# Patient Record
Sex: Female | Born: 1980 | Hispanic: No | Marital: Single | Smoking: Current every day smoker
Health system: Southern US, Community
[De-identification: ages and names within clinical notes are randomized; demographics above are authoritative.]

---

## 2016-04-07 ENCOUNTER — Emergency Department (HOSPITAL_COMMUNITY)
Admission: EM | Admit: 2016-04-07 | Discharge: 2016-04-07 | Disposition: A | Discharge status: Home / Self Care | Payer: Self-pay | Attending: Emergency Medicine | Admitting: Emergency Medicine

## 2016-04-07 ENCOUNTER — Encounter (HOSPITAL_COMMUNITY): Payer: Self-pay | Admitting: Emergency Medicine

## 2016-04-07 ENCOUNTER — Emergency Department (HOSPITAL_COMMUNITY): Payer: Self-pay

## 2016-04-07 DIAGNOSIS — Z23 Encounter for immunization: Secondary | ICD-10-CM | POA: Insufficient documentation

## 2016-04-07 DIAGNOSIS — S0093XA Contusion of unspecified part of head, initial encounter: Secondary | ICD-10-CM | POA: Insufficient documentation

## 2016-04-07 DIAGNOSIS — S0990XA Unspecified injury of head, initial encounter: Secondary | ICD-10-CM

## 2016-04-07 DIAGNOSIS — Y92410 Unspecified street and highway as the place of occurrence of the external cause: Secondary | ICD-10-CM | POA: Insufficient documentation

## 2016-04-07 DIAGNOSIS — F17210 Nicotine dependence, cigarettes, uncomplicated: Secondary | ICD-10-CM | POA: Insufficient documentation

## 2016-04-07 DIAGNOSIS — W19XXXA Unspecified fall, initial encounter: Secondary | ICD-10-CM

## 2016-04-07 DIAGNOSIS — Y999 Unspecified external cause status: Secondary | ICD-10-CM | POA: Insufficient documentation

## 2016-04-07 DIAGNOSIS — Y9339 Activity, other involving climbing, rappelling and jumping off: Secondary | ICD-10-CM | POA: Insufficient documentation

## 2016-04-07 LAB — I-STAT BETA HCG BLOOD, ED (MC, WL, AP ONLY): I-stat hCG, quantitative: <5 m[IU]/mL (ref <5)

## 2016-04-07 LAB — BASIC METABOLIC PANEL
Anion gap: 9 (ref 5–15)
BUN: 11 mg/dL (ref 6–20)
CALCIUM: 8.8 mg/dL — AB (ref 8.9–10.3)
CO2: 26 mmol/L (ref 22–32)
CREATININE: 0.67 mg/dL (ref 0.44–1.00)
Chloride: 102 mmol/L (ref 101–111)
GFR calc Af Amer: >60 mL/min (ref >60)
GLUCOSE: 107 mg/dL — AB (ref 65–99)
POTASSIUM: 3.4 mmol/L — AB (ref 3.5–5.1)
Sodium: 137 mmol/L (ref 135–145)

## 2016-04-07 LAB — CBC WITH DIFFERENTIAL/PLATELET
Basophils Absolute: 0 10*3/uL (ref 0.0–0.1)
Basophils Relative: 0 %
EOS PCT: 2 %
Eosinophils Absolute: 0.1 10*3/uL (ref 0.0–0.7)
HCT: 38.1 % (ref 36.0–46.0)
Hemoglobin: 12.8 g/dL (ref 12.0–15.0)
LYMPHS ABS: 1.3 10*3/uL (ref 0.7–4.0)
LYMPHS PCT: 21 %
MCH: 32.2 pg (ref 26.0–34.0)
MCHC: 33.6 g/dL (ref 30.0–36.0)
MCV: 95.7 fL (ref 78.0–100.0)
MONO ABS: 0.4 10*3/uL (ref 0.1–1.0)
MONOS PCT: 6 %
Neutro Abs: 4.5 10*3/uL (ref 1.7–7.7)
Neutrophils Relative %: 71 %
PLATELETS: 238 10*3/uL (ref 150–400)
RBC: 3.98 MIL/uL (ref 3.87–5.11)
RDW: 13.4 % (ref 11.5–15.5)
WBC: 6.3 10*3/uL (ref 4.0–10.5)

## 2016-04-07 LAB — ETHANOL: Alcohol, Ethyl (B): <5 mg/dL (ref <5)

## 2016-04-07 MED ORDER — TETANUS-DIPHTH-ACELL PERTUSSIS 5-2.5-18.5 LF-MCG/0.5 IM SUSP
0.5000 mL | Freq: Once | INTRAMUSCULAR | Status: AC
  Administered 2016-04-07: 0.5 mL via INTRAMUSCULAR
  Filled 2016-04-07: qty 0.5

## 2016-04-07 MED ORDER — NAPROXEN 500 MG PO TABS: 500.0000 mg | ORAL_TABLET | Freq: Two times a day (BID) | ORAL | 0 refills | Status: AC

## 2016-04-07 MED ORDER — ONDANSETRON HCL 4 MG PO TABS: 4.0000 mg | ORAL_TABLET | Freq: Four times a day (QID) | ORAL | 0 refills | Status: DC

## 2016-04-07 MED ORDER — ONDANSETRON HCL 4 MG PO TABS: 4.0000 mg | ORAL_TABLET | Freq: Four times a day (QID) | ORAL | 0 refills | Status: AC

## 2016-04-07 MED ORDER — FENTANYL CITRATE (PF) 100 MCG/2ML IJ SOLN
50.0000 ug | Freq: Once | INTRAMUSCULAR | Status: AC
  Administered 2016-04-07: 50 ug via INTRAVENOUS
  Filled 2016-04-07: qty 2

## 2016-04-07 MED ORDER — TRAMADOL HCL 50 MG PO TABS: 50.0000 mg | ORAL_TABLET | Freq: Four times a day (QID) | ORAL | 0 refills | Status: DC | PRN

## 2016-04-07 MED ORDER — ONDANSETRON HCL 4 MG/2ML IJ SOLN
4.0000 mg | Freq: Once | INTRAMUSCULAR | Status: AC
  Administered 2016-04-07: 4 mg via INTRAVENOUS
  Filled 2016-04-07: qty 2

## 2016-04-07 MED ORDER — SODIUM CHLORIDE 0.9 % IV BOLUS (SEPSIS)
1000.0000 mL | Freq: Once | INTRAVENOUS | Status: AC
  Administered 2016-04-07: 1000 mL via INTRAVENOUS

## 2016-04-07 NOTE — ED Notes (Signed)
Patient ambulated from the room to the bathroom and back without any difficulty or distress; patient placed back on continuous pulse oximetry and blood pressure cuff; family is now at bedside and patient is wanting to go home now; Donnamae JudeKeshia, RN made aware

## 2016-04-07 NOTE — ED Notes (Signed)
Cheri FowlerKayla Rose PA at bedside.

## 2016-04-07 NOTE — ED Notes (Signed)
Patient transported to CT 
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
